# Patient Record
Sex: Male | Born: 1995 | Race: Black or African American | Hispanic: No | Marital: Single | Smoking: Never smoker
Health system: Southern US, Community
[De-identification: ages and names within clinical notes are randomized; demographics above are authoritative.]

## PROBLEM LIST (undated history)

## (undated) DIAGNOSIS — J45909 Unspecified asthma, uncomplicated: Secondary | ICD-10-CM

## (undated) HISTORY — PX: WISDOM TOOTH EXTRACTION: SHX21

---
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
## 2016-05-28 ENCOUNTER — Encounter (HOSPITAL_COMMUNITY): Payer: Self-pay | Admitting: Emergency Medicine

## 2016-05-28 ENCOUNTER — Emergency Department (HOSPITAL_COMMUNITY)
Admission: EM | Admit: 2016-05-28 | Discharge: 2016-05-28 | Disposition: A | Discharge status: Home / Self Care | Payer: Medicaid Other | Attending: Emergency Medicine | Admitting: Emergency Medicine

## 2016-05-28 DIAGNOSIS — Y999 Unspecified external cause status: Secondary | ICD-10-CM | POA: Diagnosis not present

## 2016-05-28 DIAGNOSIS — T2112XA Burn of first degree of abdominal wall, initial encounter: Secondary | ICD-10-CM | POA: Diagnosis not present

## 2016-05-28 DIAGNOSIS — T2102XA Burn of unspecified degree of abdominal wall, initial encounter: Secondary | ICD-10-CM | POA: Diagnosis present

## 2016-05-28 DIAGNOSIS — Y929 Unspecified place or not applicable: Secondary | ICD-10-CM | POA: Diagnosis not present

## 2016-05-28 DIAGNOSIS — T300 Burn of unspecified body region, unspecified degree: Secondary | ICD-10-CM

## 2016-05-28 DIAGNOSIS — X17XXXA Contact with hot engines, machinery and tools, initial encounter: Secondary | ICD-10-CM | POA: Insufficient documentation

## 2016-05-28 DIAGNOSIS — J45909 Unspecified asthma, uncomplicated: Secondary | ICD-10-CM | POA: Diagnosis not present

## 2016-05-28 DIAGNOSIS — Y9389 Activity, other specified: Secondary | ICD-10-CM | POA: Diagnosis not present

## 2016-05-28 HISTORY — DX: Unspecified asthma, uncomplicated: J45.909

## 2016-05-28 MED ORDER — SILVER SULFADIAZINE 1 % EX CREA: 1.0000 "application " | TOPICAL_CREAM | Freq: Every day | CUTANEOUS | 0 refills | Status: DC

## 2016-05-28 NOTE — ED Notes (Signed)
Pt stable, ambulatory, states understanding of discharge instructions 
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
## 2016-07-08 ENCOUNTER — Emergency Department (HOSPITAL_COMMUNITY)
Admission: EM | Admit: 2016-07-08 | Discharge: 2016-07-08 | Disposition: A | Discharge status: Home / Self Care | Payer: Medicaid Other | Attending: Emergency Medicine | Admitting: Emergency Medicine

## 2016-07-08 ENCOUNTER — Emergency Department (HOSPITAL_COMMUNITY): Payer: Medicaid Other

## 2016-07-08 ENCOUNTER — Encounter (HOSPITAL_COMMUNITY): Payer: Self-pay | Admitting: *Deleted

## 2016-07-08 DIAGNOSIS — R05 Cough: Secondary | ICD-10-CM

## 2016-07-08 DIAGNOSIS — J4520 Mild intermittent asthma, uncomplicated: Secondary | ICD-10-CM | POA: Insufficient documentation

## 2016-07-08 DIAGNOSIS — J069 Acute upper respiratory infection, unspecified: Secondary | ICD-10-CM | POA: Diagnosis not present

## 2016-07-08 DIAGNOSIS — R059 Cough, unspecified: Secondary | ICD-10-CM

## 2016-07-08 MED ORDER — ALBUTEROL SULFATE HFA 108 (90 BASE) MCG/ACT IN AERS
2.0000 | INHALATION_SPRAY | Freq: Once | RESPIRATORY_TRACT | Status: AC
  Administered 2016-07-08: 2 via RESPIRATORY_TRACT
  Filled 2016-07-08: qty 6.7

## 2016-07-08 NOTE — ED Triage Notes (Signed)
Pt reports onset last night of feeling sob and cough. Hx of asthma. spo2 96% at triage. No acute distress is noted at triage.

## 2016-07-08 NOTE — ED Provider Notes (Signed)
[facility] Provider Note   CSN: [id number] Arrival date & time: 07/08/16  1531  By signing my name below, I, Arianna Nassar, attest that this documentation has been prepared under the direction and in the presence of Nira ConnPedro Eduardo Shantae Vantol, MD.  Electronically Signed: Octavia HeirArianna Nassar, ED Scribe. 07/08/16. 4:56 PM.    History   Chief Complaint Chief Complaint  Patient presents with  . Cough    The history is provided by the patient. No language interpreter was used.   HPI Comments: David Dodson is a 20 y.o. male who has a Pmhx of asthma presents to the Emergency Department complaining of intermittent, productive cough that brings up yellow sputum that started recently. He reports rhinorrhea, congestion, and wheezing. Pt has been around his brothers at home who have been sick. He reports working in the cold at his job and works 7 days a week. He states he has not had any time to rest. Pt has a hx of asthma and reports not using his inhaler due to it being very low. He has not taken any medications to relieve his pain. He denies fever  Past Medical History:  Diagnosis Date  . Asthma     There are no active problems to display for this patient.   Past Surgical History:  Procedure Laterality Date  . WISDOM TOOTH EXTRACTION         Home Medications    Prior to Admission medications   Medication Sig Start Date End Date Taking? Authorizing Provider  silver sulfADIAZINE (SILVADENE) 1 % cream Apply 1 application topically daily. 05/28/16   Anselm PancoastShawn C Joy, PA-C    Family History History reviewed. No pertinent family history.  Social History Social History  Substance Use Topics  . Smoking status: Never Smoker  . Smokeless tobacco: Never Used  . Alcohol use No     Allergies   Patient has no known allergies.   Review of Systems Review of Systems  A complete 10 system review of systems was obtained and all systems are negative except as noted in the HPI and PMH.     Physical Exam Updated Vital Signs BP 119/68 (BP Location: Right Arm)   Pulse 97   Temp 99.1 F (37.3 C) (Oral)   Resp 18   Ht 5\' 5"  (1.651 m)   Wt 145 lb (65.8 kg)   SpO2 96%   BMI 24.13 kg/m   Physical Exam  Constitutional: He is oriented to person, place, and time. He appears well-developed and well-nourished. No distress.  HENT:  Head: Normocephalic and atraumatic.  Nose: Nose normal.  Swollen nasal mucosa   Eyes: Conjunctivae and EOM are normal. Pupils are equal, round, and reactive to light. Right eye exhibits no discharge. Left eye exhibits no discharge. No scleral icterus.  Neck: Normal range of motion. Neck supple.  Cardiovascular: Normal rate and regular rhythm.  Exam reveals no gallop and no friction rub.   No murmur heard. Pulmonary/Chest: Effort normal. No stridor. No respiratory distress. He has wheezes. He has no rales.  Abdominal: Soft. He exhibits no distension. There is no tenderness.  Musculoskeletal: He exhibits no edema or tenderness.  Neurological: He is alert and oriented to person, place, and time.  Skin: Skin is warm and dry. No rash noted. He is not diaphoretic. No erythema.  Psychiatric: He has a normal mood and affect.  Vitals reviewed.    ED Treatments / Results  DIAGNOSTIC STUDIES: Oxygen Saturation is 96% on RA, normal by  my interpretation.  COORDINATION OF CARE:  4:55 PM Discussed treatment plan with pt at bedside and pt agreed to plan.  Labs (all labs ordered are listed, but only abnormal results are displayed) Labs Reviewed - No data to display  EKG  EKG Interpretation None       Radiology Dg Chest 2 View  Result Date: 07/08/2016 CLINICAL DATA:  Chest pain and cough and shortness of breath for 2 days, initial encounter EXAM: CHEST  2 VIEW COMPARISON:  01/05/2009 FINDINGS: The heart size and mediastinal contours are within normal limits. Both lungs are clear. The visualized skeletal structures are unremarkable. IMPRESSION:  No active cardiopulmonary disease. Electronically Signed   By: Alcide CleverMark  Lukens M.D.   On: 07/08/2016 16:26    Procedures Procedures (including critical care time)  Medications Ordered in ED Medications  albuterol (PROVENTIL HFA;VENTOLIN HFA) 108 (90 Base) MCG/ACT inhaler 2 puff (2 puffs Inhalation Given 07/08/16 1707)     Initial Impression / Assessment and Plan / ED Course  I have reviewed the triage vital signs and the nursing notes.  Pertinent labs & imaging results that were available during my care of the patient were reviewed by me and considered in my medical decision making (see chart for details).  Clinical Course     20 y.o. male presents with cough, rhinorrhea, nasal congestion for several days. adequate oral hydration. Rest of history as above.  Patient appears well. No signs of toxicity, patient is interactive and playful. No hypoxia, tachypnea or other signs of respiratory distress. No sign of clinical dehydration. Lung exam wheezing. Rest of exam as above.  Chest x-ray w/o PNA. Wheezing improved following albuterol puffs.  Most consistent with viral upper respiratory infection.   No evidence suggestive of pharyngitis, AOM, PNA, or meningitis.    Discussed symptomatic treatment with the patient and they will follow closely with their PCP.    Final Clinical Impressions(s) / ED Diagnoses   Final diagnoses:  Upper respiratory tract infection, unspecified type  Cough  Mild intermittent asthma without complication   Disposition: Discharge  Condition: Good  I have discussed the results, Dx and Tx plan with the patient who expressed understanding and agree(s) with the plan. Discharge instructions discussed at great length. The patient was given strict return precautions who verbalized understanding of the instructions. No further questions at time of discharge.    Discharge Medication List as of 07/08/2016  5:00 PM      Follow Up: [facility] [address] [place] [id number] [phone] Call  For help establishing care with a care provider   I personally performed the services described in this documentation, which was scribed in my presence. The recorded information has been reviewed and is accurate.        Nira ConnPedro Eduardo Harriet Sutphen, MD 07/09/16 1240

## 2016-07-08 NOTE — ED Notes (Signed)
Pt transported to xray from lobby, will then be transported to [facility]

## 2016-10-15 ENCOUNTER — Observation Stay (HOSPITAL_COMMUNITY)
Admission: EM | Admit: 2016-10-15 | Discharge: 2016-10-16 | Disposition: A | Discharge status: Home / Self Care | Payer: Medicaid Other | Attending: Infectious Disease | Admitting: Infectious Disease

## 2016-10-15 ENCOUNTER — Emergency Department (HOSPITAL_COMMUNITY): Payer: Medicaid Other

## 2016-10-15 ENCOUNTER — Encounter (HOSPITAL_COMMUNITY): Payer: Self-pay

## 2016-10-15 DIAGNOSIS — J45901 Unspecified asthma with (acute) exacerbation: Secondary | ICD-10-CM | POA: Diagnosis not present

## 2016-10-15 DIAGNOSIS — J4541 Moderate persistent asthma with (acute) exacerbation: Principal | ICD-10-CM | POA: Insufficient documentation

## 2016-10-15 DIAGNOSIS — J9601 Acute respiratory failure with hypoxia: Secondary | ICD-10-CM | POA: Diagnosis not present

## 2016-10-15 DIAGNOSIS — R062 Wheezing: Secondary | ICD-10-CM | POA: Diagnosis present

## 2016-10-15 LAB — CBC WITH DIFFERENTIAL/PLATELET
BASOS ABS: 0 10*3/uL (ref 0.0–0.1)
BASOS PCT: 0 %
EOS ABS: 0.1 10*3/uL (ref 0.0–0.7)
EOS PCT: 1 %
HCT: 44.3 % (ref 39.0–52.0)
Hemoglobin: 15.8 g/dL (ref 13.0–17.0)
Lymphocytes Relative: 7 %
Lymphs Abs: 1 10*3/uL (ref 0.7–4.0)
MCH: 31.7 pg (ref 26.0–34.0)
MCHC: 35.7 g/dL (ref 30.0–36.0)
MCV: 88.8 fL (ref 78.0–100.0)
MONO ABS: 1.2 10*3/uL — AB (ref 0.1–1.0)
Monocytes Relative: 8 %
NEUTROS ABS: 12.4 10*3/uL — AB (ref 1.7–7.7)
Neutrophils Relative %: 84 %
PLATELETS: 221 10*3/uL (ref 150–400)
RBC: 4.99 MIL/uL (ref 4.22–5.81)
RDW: 12.7 % (ref 11.5–15.5)
WBC: 14.7 10*3/uL — ABNORMAL HIGH (ref 4.0–10.5)

## 2016-10-15 LAB — BASIC METABOLIC PANEL
ANION GAP: 11 (ref 5–15)
BUN: 9 mg/dL (ref 6–20)
CALCIUM: 9.5 mg/dL (ref 8.9–10.3)
CO2: 26 mmol/L (ref 22–32)
CREATININE: 1.04 mg/dL (ref 0.61–1.24)
Chloride: 103 mmol/L (ref 101–111)
GFR calc Af Amer: >60 mL/min (ref >60)
GLUCOSE: 129 mg/dL — AB (ref 65–99)
Potassium: 3.5 mmol/L (ref 3.5–5.1)
Sodium: 140 mmol/L (ref 135–145)

## 2016-10-15 LAB — INFLUENZA PANEL BY PCR (TYPE A & B)
INFLAPCR: NEGATIVE
Influenza B By PCR: NEGATIVE

## 2016-10-15 MED ORDER — METHYLPREDNISOLONE SODIUM SUCC 125 MG IJ SOLR
125.0000 mg | Freq: Once | INTRAMUSCULAR | Status: AC
  Administered 2016-10-15: 125 mg via INTRAVENOUS
  Filled 2016-10-15: qty 2

## 2016-10-15 MED ORDER — ALBUTEROL (5 MG/ML) CONTINUOUS INHALATION SOLN
10.0000 mg/h | INHALATION_SOLUTION | Freq: Once | RESPIRATORY_TRACT | Status: AC
  Administered 2016-10-15: 10 mg/h via RESPIRATORY_TRACT
  Filled 2016-10-15: qty 20

## 2016-10-15 MED ORDER — IPRATROPIUM-ALBUTEROL 0.5-2.5 (3) MG/3ML IN SOLN
RESPIRATORY_TRACT | Status: AC
Start: 2016-10-15 — End: 2016-10-15
  Filled 2016-10-15: qty 3

## 2016-10-15 MED ORDER — ALBUTEROL SULFATE (2.5 MG/3ML) 0.083% IN NEBU
5.0000 mg | INHALATION_SOLUTION | Freq: Once | RESPIRATORY_TRACT | Status: AC
  Administered 2016-10-15: 5 mg via RESPIRATORY_TRACT

## 2016-10-15 MED ORDER — IPRATROPIUM-ALBUTEROL 0.5-2.5 (3) MG/3ML IN SOLN
3.0000 mL | Freq: Four times a day (QID) | RESPIRATORY_TRACT | Status: DC
  Administered 2016-10-15 – 2016-10-16 (×4): 3 mL via RESPIRATORY_TRACT
  Filled 2016-10-15 (×5): qty 3

## 2016-10-15 MED ORDER — POTASSIUM CHLORIDE CRYS ER 20 MEQ PO TBCR: 40.0000 meq | EXTENDED_RELEASE_TABLET | Freq: Once | ORAL | Status: DC

## 2016-10-15 MED ORDER — ENOXAPARIN SODIUM 40 MG/0.4ML ~~LOC~~ SOLN
40.0000 mg | SUBCUTANEOUS | Status: DC
  Filled 2016-10-15: qty 0.4

## 2016-10-15 MED ORDER — MAGNESIUM SULFATE 2 GM/50ML IV SOLN
2.0000 g | Freq: Once | INTRAVENOUS | Status: AC
  Administered 2016-10-15: 2 g via INTRAVENOUS
  Filled 2016-10-15: qty 50

## 2016-10-15 MED ORDER — ALBUTEROL SULFATE (2.5 MG/3ML) 0.083% IN NEBU
INHALATION_SOLUTION | RESPIRATORY_TRACT | Status: AC
  Filled 2016-10-15: qty 6

## 2016-10-15 MED ORDER — IPRATROPIUM-ALBUTEROL 0.5-2.5 (3) MG/3ML IN SOLN
3.0000 mL | Freq: Once | RESPIRATORY_TRACT | Status: AC
Start: 2016-10-15 — End: 2016-10-15
  Administered 2016-10-15: 3 mL via RESPIRATORY_TRACT

## 2016-10-15 MED ORDER — PREDNISONE 20 MG PO TABS
40.0000 mg | ORAL_TABLET | Freq: Every day | ORAL | Status: DC
  Administered 2016-10-16: 40 mg via ORAL
  Filled 2016-10-15: qty 2

## 2016-10-15 NOTE — H&P (Signed)
Date: 10/15/2016               Patient Name:  David Dodson MRN: [id number]  DOB: March 17, 1996 Age / Sex: 21 y.o., male   PCP: No Pcp Per Patient         Medical Service: Internal Medicine Teaching Service         Attending Physician: Dr. Randall Hissornelius N Van Dam, MD    First Contact: Dr. Carolynn CommentBryan Annalee Meyerhoff Pager: [phone]  Second Contact: Dr. Orest Dikeshris Rice Pager: [phone]       After Hours (After 5p /  First Contact Pager: [phone]  Weekends / Holidays): Second Contact Pager: [phone]   Chief Complaint: SOB  History of Present Illness: David Dodson is a 21 y.o. male with a h/o of asthma who presents with wheezing and SOB found to be hypoxic in the ED.  Pt reports acute onset of wheezing and SOB yesterday while playing video games. He reports that he experienced SOB and chest tightness. He had a cough which was non-productive. He describes that his chest began to hurt after coughing, but denies any sharp, aching, or burning sensation and reports it felt "tight" and "like I couldn't breathe." He went to take a hot shower and vomited in the shower, then twice more in rapid succession. He denies any feeling of nausea prior to this emesis or following. He had not issues during the night, but awoke with significant SOB and knew that he needed to go to the hospital. He does report that he tried to use his Flovent at home, but had only mild relief from this. He reportedly does not take this regularly.  He has a h/o asthma since early childhood, but has not had significant exacerbation in several years. His mother reports that prior exacerbations were triggered by changes to the weather and respiratory infections. He denies any recent sick contacts, fever/chills, sore throat, rhinorrhea, nasal/sinus congestion, HA, myalgias. Denies abd pain, diarrhea, urinary symptoms. No LE swelling, no pleuritic pain. He does not have a regular doctor in town since [place]ocating to [place] 1 [place] ago, but reports that he  had followed with a pediatric specialist in the past.  His O2 sats were down to 87% on RA w/ good response to supplemental O2 via [place]. He was given methylprednisolone and nebulizer treatments and admitted to IMTS. His chest pain was resolved upon our interview and breathing was comfortable on [place]. No further coughing since arrival.  Meds: Current Outpatient Prescriptions  Medication Sig Dispense Refill  . albuterol (PROVENTIL HFA;VENTOLIN HFA) 108 (90 Base) MCG/ACT inhaler Inhale 1 puff into the lungs every 6 (six) hours as needed for wheezing or shortness of breath.    . fluticasone (FLOVENT HFA) 110 MCG/ACT inhaler Inhale 1 puff into the lungs 2 (two) times daily.     Allergies: Allergies as of 10/15/2016  . (No Known Allergies)   Past Medical History:  Diagnosis Date  . Asthma    Family History: Pt has no family h/o lung disease.  Social History: Pt  reports that he has never smoked. He has never used smokeless tobacco. He reports that he uses drugs, including Marijuana. He reports that he does not drink alcohol.  Review of Systems: A complete ROS was negative except as per HPI. Review of Systems  Constitutional: Negative for chills, fever and malaise/fatigue.  HENT: Negative for congestion, sinus pain and sore throat.   Respiratory: Positive for cough, shortness of breath and wheezing. Negative for  hemoptysis and sputum production.   Cardiovascular: Positive for chest pain (tightness). Negative for leg swelling.  Gastrointestinal: Positive for vomiting. Negative for abdominal pain, constipation, diarrhea and nausea.  Neurological: Negative for dizziness, loss of consciousness, weakness and headaches.   Physical Exam: Vitals:   10/15/16 1121 10/15/16 1124 10/15/16 1126 10/15/16 1130  BP:    119/75  Pulse: (!) 120 (!) 117 (!) 128 (!) 119  Resp: (!) 29 (!) 27 (!) 26 (!) 21  Temp:      TempSrc:      SpO2: (!) 87% (!) 87% (!) 88% 92%  Weight:      Height:       Physical  Exam  Constitutional: He is oriented to person, place, and time. He appears well-developed and well-nourished. He is cooperative. No distress.  HENT:  Head: Normocephalic and atraumatic.  Right Ear: Hearing normal.  Left Ear: Hearing normal.  Nose: Nose normal. Right sinus exhibits no maxillary sinus tenderness and no frontal sinus tenderness. Left sinus exhibits no maxillary sinus tenderness and no frontal sinus tenderness.  Mouth/Throat: Mucous membranes are normal. Posterior oropharyngeal erythema present.  Cardiovascular: Regular rhythm, S1 normal, S2 normal and intact distal pulses.  Tachycardia present.  Exam reveals no gallop.   No murmur heard. Pulmonary/Chest: Effort normal. No respiratory distress. He has wheezes. He has no rhonchi. He has no rales. He exhibits no tenderness.  Good air movement to bases. Comfortable on 2L [place]  Abdominal: Soft. Normal appearance and bowel sounds are normal. He exhibits no ascites. There is no hepatosplenomegaly. There is no tenderness.  Musculoskeletal: He exhibits no edema.  Lymphadenopathy:       Head (right side): Submandibular adenopathy present.       Head (left side): No submandibular adenopathy present.    He has no cervical adenopathy.  Neurological: He is alert and oriented to person, place, and time. He has normal strength.  Skin: Skin is warm, dry and intact. He is not diaphoretic.  Psychiatric: He has a normal mood and affect. His speech is normal and behavior is normal.   Labs: CBC:  Recent Labs Lab 10/15/16 0943  WBC 14.7*  NEUTROABS 12.4*  HGB 15.8  HCT 44.3  MCV 88.8  PLT 221   Basic Metabolic Panel:  Recent Labs Lab 10/15/16 0943  NA 140  K 3.5  CL 103  CO2 26  GLUCOSE 129*  BUN 9  CREATININE 1.04  CALCIUM 9.5   Imaging: Dg Chest 2 View  Result Date: 10/15/2016 CLINICAL DATA:  Dyspnea EXAM: CHEST  2 VIEW COMPARISON:  07/08/2016 chest radiograph. FINDINGS: Stable cardiomediastinal silhouette with normal  heart size. No pneumothorax. No pleural effusion. Lungs appear clear, with no acute consolidative airspace disease and no pulmonary edema. Visualized osseous structures appear intact. IMPRESSION: No active cardiopulmonary disease. Electronically Signed   By: Delbert Phenix M.D.   On: 10/15/2016 09:24   Assessment & Plan by Problem: Active Problems:   Acute asthma exacerbation  David Dodson is a 21 y.o. male with h/o asthma who presents with SOB, wheezing, and non-productive cough thought to be 2/2 acute asthma exacerbation.  1) Acute Hypoxic Resp Failure 2/2 Asthma Exacerbation: h/o asthma, now with acute worsening of wheeze, SOB and chest tightness. Resolved with ED nebs and methylpred. No h/o tobacco abuse, reports some second hand exposure. H/o exacerbation with cold weather and past viral illness. No fevers/chills, but leukocytosis to 14. CXR clear. Home meds: albuterol (not used recently), fluticasone (not  used daily). - admit to tele - Influenza PCR - s/p IV methylpred, continue PO pred 40mg  x5 days - Duonebs q6h - peak flow  DVT PPx - low molecular weight heparin  Code Status - Full  Dispo: Admit patient to Observation with expected length of stay less than 2 midnights.  Signed: Carolynn Comment, MD 10/15/2016, 11:44 AM  Pager: [phone]

## 2016-10-15 NOTE — ED Triage Notes (Signed)
Per Pt, Pt is coming from home with inspiratory and expiratory wheezing that started yesterday. Hx of Asthma. Denies no relief with inhaler.

## 2016-10-15 NOTE — ED Notes (Signed)
sats 87% on room air; now 95% on 2 L [place].

## 2016-10-15 NOTE — ED Notes (Addendum)
Pt returned from xray; lips look purple tinged and color in face looks dusky.

## 2016-10-15 NOTE — ED Notes (Signed)
Pt's room air sat is 89-91% with good waveform while lying in bed. RN informed MD and 2 L [place] placed. RN will not ambulate at this time

## 2016-10-15 NOTE — ED Notes (Signed)
Attempted report 

## 2016-10-15 NOTE — ED Notes (Signed)
Admitting at bedside 

## 2016-10-15 NOTE — ED Notes (Signed)
Resp at bedside to evaluate.

## 2016-10-15 NOTE — ED Notes (Signed)
Breathing tx continues; pt has no needs and no signs of distress.

## 2016-10-15 NOTE — ED Notes (Signed)
Pt's breathing tx finished. Airway is opened up more. RN to assess how patient maintains on room air. MD updated.

## 2016-10-15 NOTE — ED Provider Notes (Signed)
[facility]-EMER[facility]ENCY DEPT Provider Note   CSN: [id number] Arrival date & time: 10/15/16  [id number]     History   Chief Complaint Chief Complaint  Patient presents with  . Wheezing    HPI David Dodson is a 21 y.o. male.  Patient is a 21 year old male with past medical history of asthma. He presents for evaluation of wheezing and shortness of breath. This is worsened over the past several days. He denies any fevers, chills, but does report he has been coughing. His cough is been nonproductive. He is a prior smoker, however quit many weeks ago.   The history is provided by the patient.  Wheezing   This is a new problem. The current episode started 2 days ago. The problem occurs constantly. The problem has been gradually worsening. Pertinent negatives include no chest pain and no fever. There was no precipitant for this problem. He has tried beta-agonist inhalers for the symptoms. The treatment provided no relief. His past medical history is significant for asthma.    Past Medical History:  Diagnosis Date  . Asthma     There are no active problems to display for this patient.   Past Surgical History:  Procedure Laterality Date  . WISDOM TOOTH EXTRACTION         Home Medications    Prior to Admission medications   Medication Sig Start Date End Date Taking? Authorizing Provider  silver sulfADIAZINE (SILVADENE) 1 % cream Apply 1 application topically daily. 05/28/16   Anselm PancoastShawn C Joy, PA-C    Family History No family history on file.  Social History Social History  Substance Use Topics  . Smoking status: Never Smoker  . Smokeless tobacco: Never Used  . Alcohol use No     Allergies   Patient has no known allergies.   Review of Systems Review of Systems  Constitutional: Negative for fever.  Respiratory: Positive for wheezing.   Cardiovascular: Negative for chest pain.  All other systems reviewed and are negative.    Physical Exam Updated Vital Signs BP 136/85    Pulse (!) 120   Temp 97.8 F (36.6 C) (Oral)   Resp (!) 24   Ht 5\' 4"  (1.626 m)   Wt 140 lb (63.5 kg)   SpO2 98%   BMI 24.03 kg/m   Physical Exam  Constitutional: He is oriented to person, place, and time. He appears well-developed and well-nourished. No distress.  HENT:  Head: Normocephalic and atraumatic.  Mouth/Throat: Oropharynx is clear and moist.  Neck: Normal range of motion. Neck supple.  Cardiovascular: Normal rate and regular rhythm.  Exam reveals no friction rub.   No murmur heard. Pulmonary/Chest: He is in respiratory distress. He has wheezes. He has no rales.  There are bilateral expiratory wheezes present. He is in mild respiratory distress.  Abdominal: Soft. Bowel sounds are normal. He exhibits no distension. There is no tenderness.  Musculoskeletal: Normal range of motion. He exhibits no edema.  Neurological: He is alert and oriented to person, place, and time. Coordination normal.  Skin: Skin is warm and dry. He is not diaphoretic.  Nursing note and vitals reviewed.    ED Treatments / Results  Labs (all labs ordered are listed, but only abnormal results are displayed) Labs Reviewed  BASIC METABOLIC PANEL  CBC WITH DIFFERENTIAL/PLATELET    EKG  EKG Interpretation None       Radiology Dg Chest 2 View  Result Date: 10/15/2016 CLINICAL DATA:  Dyspnea EXAM: CHEST  2 VIEW COMPARISON:  07/08/2016 chest radiograph. FINDINGS: Stable cardiomediastinal silhouette with normal heart size. No pneumothorax. No pleural effusion. Lungs appear clear, with no acute consolidative airspace disease and no pulmonary edema. Visualized osseous structures appear intact. IMPRESSION: No active cardiopulmonary disease. Electronically Signed   By: Delbert Phenix M.D.   On: 10/15/2016 09:24    Procedures Procedures (including critical care time)  Medications Ordered in ED Medications  albuterol (PROVENTIL) (2.5 MG/3ML) 0.083% nebulizer solution (not administered)    ipratropium-albuterol (DUONEB) 0.5-2.5 (3) MG/3ML nebulizer solution 3 mL (not administered)  methylPREDNISolone sodium succinate (SOLU-MEDROL) 125 mg/2 mL injection 125 mg (not administered)  magnesium sulfate IVPB 2 g 50 mL (not administered)  albuterol (PROVENTIL,VENTOLIN) solution continuous neb (not administered)  albuterol (PROVENTIL) (2.5 MG/3ML) 0.083% nebulizer solution 5 mg (5 mg Nebulization Given 10/15/16 0855)     Initial Impression / Assessment and Plan / ED Course  I have reviewed the triage vital signs and the nursing notes.  Pertinent labs & imaging results that were available during my care of the patient were reviewed by me and considered in my medical decision making (see chart for details).  Patient with severe asthma exacerbation. Remains hypoxic after continuous albuterol, duonebs, steroids, and magnesium. He will be admitted to the teaching service for further treatment.  CRITICAL CARE Performed by: Geoffery Lyons Total critical care time: 30 minutes Critical care time was exclusive of separately billable procedures and treating other patients. Critical care was necessary to treat or prevent imminent or life-threatening deterioration. Critical care was time spent personally by me on the following activities: development of treatment plan with patient and/or surrogate as well as nursing, discussions with consultants, evaluation of patient's response to treatment, examination of patient, obtaining history from patient or surrogate, ordering and performing treatments and interventions, ordering and review of laboratory studies, ordering and review of radiographic studies, pulse oximetry and re-evaluation of patient's condition.   Final Clinical Impressions(s) / ED Diagnoses   Final diagnoses:  None    New Prescriptions New Prescriptions   No medications on file     Geoffery Lyons, MD 10/15/16 1345

## 2016-10-16 DIAGNOSIS — J45901 Unspecified asthma with (acute) exacerbation: Secondary | ICD-10-CM | POA: Diagnosis not present

## 2016-10-16 DIAGNOSIS — J9601 Acute respiratory failure with hypoxia: Secondary | ICD-10-CM

## 2016-10-16 LAB — CBC
HEMATOCRIT: 41.2 % (ref 39.0–52.0)
Hemoglobin: 14.4 g/dL (ref 13.0–17.0)
MCH: 31 pg (ref 26.0–34.0)
MCHC: 35 g/dL (ref 30.0–36.0)
MCV: 88.6 fL (ref 78.0–100.0)
PLATELETS: 248 10*3/uL (ref 150–400)
RBC: 4.65 MIL/uL (ref 4.22–5.81)
RDW: 12.8 % (ref 11.5–15.5)
WBC: 12.9 10*3/uL — ABNORMAL HIGH (ref 4.0–10.5)

## 2016-10-16 LAB — HIV ANTIBODY (ROUTINE TESTING W REFLEX): HIV SCREEN 4TH GENERATION: NONREACTIVE

## 2016-10-16 MED ORDER — ALBUTEROL SULFATE HFA 108 (90 BASE) MCG/ACT IN AERS: 1.0000 | INHALATION_SPRAY | Freq: Four times a day (QID) | RESPIRATORY_TRACT | 0 refills | Status: DC | PRN

## 2016-10-16 MED ORDER — PREDNISONE 20 MG PO TABS: 40.0000 mg | ORAL_TABLET | Freq: Every day | ORAL | 0 refills | Status: AC

## 2016-10-16 MED ORDER — ORAL CARE MOUTH RINSE
15.0000 mL | Freq: Two times a day (BID) | OROMUCOSAL | Status: DC
  Administered 2016-10-16: 15 mL via OROMUCOSAL

## 2016-10-16 NOTE — Progress Notes (Signed)
Discharge instructions (including medications) discussed with and copy provided to patient/caregiver 

## 2016-10-16 NOTE — Progress Notes (Signed)
   Subjective: Currently, the patient feels much improved. Breathing stable ON. Wheeze better, w/o cough. Ready for DC home. Understands peak flow and how to use. Will refer to PCP for continued eval and management of asthma.  Objective: Vital signs in last 24 hours: Vitals:   10/15/16 2210 10/16/16 0224 10/16/16 0510 10/16/16 0819  BP: (!) 123/53  122/66 127/72  Pulse: (!) 107 (!) 106 94 94  Resp: 18 18 18 18   Temp: 99.1 F (37.3 C)  98.6 F (37 C) 98 F (36.7 C)  TempSrc: Oral  Oral Oral  SpO2: 98% 98% 97% 94%  Weight: 144 lb 10 oz (65.6 kg)     Height:       Physical Exam: Physical Exam  Constitutional: No distress.  Cardiovascular: Normal rate, regular rhythm and normal heart sounds.   Pulmonary/Chest: Effort normal. No respiratory distress. He has wheezes (scattered, lungs mildly tight). He has no rales.  Abdominal: Soft. Bowel sounds are normal. There is no tenderness.  Musculoskeletal: He exhibits no edema.   Labs: CBC:  Recent Labs Lab 10/15/16 0943 10/16/16 0315  WBC 14.7* 12.9*  NEUTROABS 12.4*  --   HGB 15.8 14.4  HCT 44.3 41.2  MCV 88.8 88.6  PLT 221 248   Metabolic Panel:  Recent Labs Lab 10/15/16 0943  NA 140  K 3.5  CL 103  CO2 26  GLUCOSE 129*  BUN 9  CREATININE 1.04  CALCIUM 9.5    Medications: Scheduled Medications: . enoxaparin (LOVENOX) injection  40 mg Subcutaneous Q24H  . ipratropium-albuterol  3 mL Nebulization Q6H  . mouth rinse  15 mL Mouth Rinse BID  . predniSONE  40 mg Oral Q breakfast   Assessment/Plan: Pt is a 21 y.o. yo male with a PMHx of asthma who was admitted on 10/15/2016 with symptoms of wheezing and SOB, which was determined to be secondary to asthma exacerbation.  1) Acute Hypoxic Resp Failure 2/2 Asthma Exacerbation: Resolving w/o O2 requirement today. Still mild wheeze and slightly tight lungs. Influenza negative, WBC trending down. Will DC w/ completion of 5 day pred and outpt f/u. - PO pred 40mg  x5 days -  albuterol HFA PRN - peak flow  Length of Stay: 0 day(s) Dispo: Anticipated discharge today.  Carolynn CommentBryan Trevyon Swor, MD Pager: [phone] (7AM-5PM) 10/16/2016, 11:28 AM

## 2016-10-16 NOTE — Discharge Summary (Signed)
Name: David Dodson MRN: [id number] DOB: [birth year] 21 y.o. PCP: No Pcp Per Patient  Date of Admission: 10/15/2016  9:08 AM Date of Discharge: 10/16/2016 Attending Physician: Randall Hiss, MD  Discharge Diagnosis: Active Problems:   Acute asthma exacerbation   Acute respiratory failure with hypoxia (HCC) secondary to asthma exacerbation  Discharge Medications: Allergies as of 10/16/2016   No Known Allergies     Medication List    STOP taking these medications   fluticasone 110 MCG/ACT inhaler Commonly known as:  FLOVENT HFA   silver sulfADIAZINE 1 % cream Commonly known as:  SILVADENE     TAKE these medications   albuterol 108 (90 Base) MCG/ACT inhaler Commonly known as:  PROVENTIL HFA;VENTOLIN HFA Inhale 1 puff into the lungs every 6 (six) hours as needed for wheezing or shortness of breath.   predniSONE 20 MG tablet Commonly known as:  DELTASONE Take 2 tablets (40 mg total) by mouth daily with breakfast. Start taking on:  10/17/2016      Disposition and follow-up:   Mr.David Dodson was discharged from [facility] in Good condition.  At the hospital follow up visit please address:  1.  Asthma: Appears to have been previously well controlled w/o medications. Assess for resolution of correct exacerbation. Consider formal peak flow testing and follow for need of chronic ICS therapy.  2.  Labs / imaging needed at time of follow-up: consider Peak Flow  Follow-up Appointments: Referral for PCP have been offered.  Hospital Course by problem list: Active Problems:   Acute asthma exacerbation   Acute respiratory failure with hypoxia (HCC) secondary to asthma exacerbation   1. Hypoxic respiratory failure secondary to asthma exacerbation: Patient presented with acute shortness of breath 1 day prior to his admission. He reported that he felt very wheezy and had difficulty breathing. His symptoms lasted for several hours and were not well relieved  with his home Flovent medication. Patient has a history of asthma and has not had recent exacerbations several years. Patient went to bed and woke up the next morning feeling significantly worse in terms of his breathing. At that time he presented to the ED and was found to be hypoxic on room air to 87% which responded well to supplemental oxygen. He was given IV methylprednisone and continuous albuterol nebulizer treatment for one hour and had significant resolution of his symptoms. Patient however was unable to maintain his oxygen saturation without supplemental support and was admitted for overnight observation. By the next morning patient is feeling significantly better and was able to maintain his oxygen saturations on room air. He tolerated ambulation well and was discharged home with a continuation of prednisone for 4 more days. He was also discharged with a refill of his albuterol inhaler and instructions to discontinue Flovent until he has follow-up with his PCP. Patient should have more formal peak flow testing in order to assess the severity of his asthma and a regular follow-up with her PCP in order to assess whether he needs controller medications. Referral for her PCP has been made.  Discharge Vitals:   BP 127/72 (BP Location: Right Arm)   Pulse 94   Temp 98 F (36.7 C) (Oral)   Resp 18   Ht 5\' 4"  (1.626 m)   Wt 144 lb 10 oz (65.6 kg)   SpO2 94%   BMI 24.82 kg/m   Pertinent Labs, Studies, and Procedures: As below.  Procedures Performed:  Dg Chest 2 View  Result Date: 10/15/2016 CLINICAL DATA:  Dyspnea EXAM: CHEST  2 VIEW COMPARISON:  07/08/2016 chest radiograph. FINDINGS: Stable cardiomediastinal silhouette with normal heart size. No pneumothorax. No pleural effusion. Lungs appear clear, with no acute consolidative airspace disease and no pulmonary edema. Visualized osseous structures appear intact. IMPRESSION: No active cardiopulmonary disease. Electronically Signed   By: Delbert PhenixJason A  Poff M.D.   On: 10/15/2016 09:24   Discharge Instructions: Discharge Instructions    Call MD for:  difficulty breathing, headache or visual disturbances    Complete by:  As directed    Call MD for:  extreme fatigue    Complete by:  As directed    Call MD for:  persistant dizziness or light-headedness    Complete by:  As directed    Diet - low sodium heart healthy    Complete by:  As directed    Discharge instructions    Complete by:  As directed    You had an exacerbation of your asthma. Please take the the Prednisone for 4 more days to help you recover. Keep your albuterol inhaler handy in case you have other exacerbations and use this as needed. We will provide options for you to establish care with a Primary Care Doctor. You should let your doctor know if you have problems with wheezing in the future.   Increase activity slowly    Complete by:  As directed       Signed: Carolynn CommentBryan Nashae Maudlin, MD 10/16/2016, 1:59 PM   Pager: [phone]

## 2016-10-16 NOTE — Discharge Instructions (Signed)
Asthma, Adult Asthma is a condition of the lungs in which the airways tighten and narrow. Asthma can make it hard to breathe. Asthma cannot be cured, but medicine and lifestyle changes can help control it. Asthma may be started (triggered) by:  Animal skin flakes (dander).  Dust.  Cockroaches.  Pollen.  Mold.  Smoke.  Cleaning products.  Hair sprays or aerosol sprays.  Paint fumes or strong smells.  Cold air, weather changes, and winds.  Crying or laughing hard.  Stress.  Certain medicines or drugs.  Foods, such as dried fruit, potato chips, and sparkling grape juice.  Infections or conditions (colds, flu).  Exercise.  Certain medical conditions or diseases.  Exercise or tiring activities. Follow these instructions at home:  Take medicine as told by your doctor.  Use a peak flow meter as told by your doctor. A peak flow meter is a tool that measures how well the lungs are working.  Record and keep track of the peak flow meter's readings.  Understand and use the asthma action plan. An asthma action plan is a written plan for taking care of your asthma and treating your attacks.  To help prevent asthma attacks:  Do not smoke. Stay away from secondhand smoke.  Change your heating and air conditioning filter often.  Limit your use of fireplaces and wood stoves.  Get rid of pests (such as roaches and mice) and their droppings.  Throw away plants if you see mold on them.  Clean your floors. Dust regularly. Use cleaning products that do not smell.  Have someone vacuum when you are not home. Use a vacuum cleaner with a HEPA filter if possible.  Replace carpet with wood, tile, or vinyl flooring. Carpet can trap animal skin flakes and dust.  Use allergy-proof pillows, mattress covers, and box spring covers.  Wash bed sheets and blankets every week in hot water and dry them in a dryer.  Use blankets that are made of polyester or cotton.  Clean bathrooms  and kitchens with bleach. If possible, have someone repaint the walls in these rooms with mold-resistant paint. Keep out of the rooms that are being cleaned and painted.  Wash hands often. Contact a doctor if:  You have make a whistling sound when breaking (wheeze), have shortness of breath, or have a cough even if taking medicine to prevent attacks.  The colored mucus you cough up (sputum) is thicker than usual.  The colored mucus you cough up changes from clear or white to yellow, green, gray, or bloody.  You have problems from the medicine you are taking such as:  A rash.  Itching.  Swelling.  Trouble breathing.  You need reliever medicines more than 2-3 times a week.  Your peak flow measurement is still at 50-79% of your personal best after following the action plan for 1 hour.  You have a fever. Get help right away if:  You seem to be worse and are not responding to medicine during an asthma attack.  You are short of breath even at rest.  You get short of breath when doing very little activity.  You have trouble eating, drinking, or talking.  You have chest pain.  You have a fast heartbeat.  Your lips or finge  Asthma Attack Prevention, Adult Although you may not be able to control the fact that you have asthma, you can take actions to prevent episodes of asthma (asthma attacks). These actions include: Creating a written plan for managing and treating  your asthma attacks (asthma action plan). Monitoring your asthma. Avoiding things that can irritate your airways or make your asthma symptoms worse (asthma triggers). Taking your medicines as directed. Acting quickly if you have signs or symptoms of an asthma attack. What are some ways to prevent an asthma attack? Create a plan  Work with your health care provider to create an asthma action plan. This plan should include: A list of your asthma triggers and how to avoid them. A list of symptoms that you  experience during an asthma attack. Information about when to take medicine and how much medicine to take. Information to help you understand your peak flow measurements. Contact information for your health care providers. Daily actions that you can take to control asthma. Monitor your asthma   To monitor your asthma: Use your peak flow meter every morning and every evening for 2-3 weeks. Record the results in a journal. A drop in your peak flow numbers on one or more days may mean that you are starting to have an asthma attack, even if you are not having symptoms. When you have asthma symptoms, write them down in a journal. Avoid asthma triggers   Work with your health care provider to find out what your asthma triggers are. This can be done by: Being tested for allergies. Keeping a journal that notes when asthma attacks occur and what may have contributed to them. Asking your health care provider whether other medical conditions make your asthma worse. Common asthma triggers include: Dust. Smoke. This includes campfire smoke and secondhand smoke from tobacco products. Pet dander. Trees, grasses or pollens. Very cold, dry, or humid air. Mold. Foods that contain high amounts of sulfites. Strong smells. Engine exhaust and air pollution. Aerosol sprays and fumes from household cleaners. Household pests and their droppings, including dust mites and cockroaches. Certain medicines, including NSAIDs. Once you have determined your asthma triggers, take steps to avoid them. Depending on your triggers, you may be able to reduce the chance of an asthma attack by: Keeping your home clean. Have someone dust and vacuum your home for you 1 or 2 times a week. If possible, have them use a high-efficiency particulate arrestance (HEPA) vacuum. Washing your sheets weekly in hot water. Using allergy-proof mattress covers and casings on your bed. Keeping pets out of your home. Taking care of mold and  water problems in your home. Avoiding areas where people smoke. Avoiding using strong perfumes or odor sprays. Avoid spending a lot of time outdoors when pollen counts are high and on very windy days. Talking with your health care provider before stopping or starting any new medicines. Medicines  Take over-the-counter and prescription medicines only as told by your health care provider. Many asthma attacks can be prevented by carefully following your medicine schedule. Taking your medicines correctly is especially important when you cannot avoid certain asthma triggers. Even if you are doing well, do not stop taking your medicine and do not take less medicine. Act quickly  If an asthma attack happens, acting quickly can decrease how severe it is and how long it lasts. Take these actions: Pay attention to your symptoms. If you are coughing, wheezing, or having difficulty breathing, do not wait to see if your symptoms go away on their own. Follow your asthma action plan. If you have followed your asthma action plan and your symptoms are not improving, call your health care provider or seek immediate medical care at the nearest hospital. It is important  to write down how often you need to use your fast-acting rescue inhaler. You can track how often you use an inhaler in your journal. If you are using your rescue inhaler more often, it may mean that your asthma is not under control. Adjusting your asthma treatment plan may help you to prevent future asthma attacks and help you to gain better control of your condition. How can I prevent an asthma attack when I exercise?   Exercise is a common asthma trigger. To prevent asthma attacks during exercise: Follow advice from your health care provider about whether you should use your fast-acting inhaler before exercising. Many people with asthma experience exercise-induced bronchoconstriction (EIB). This condition often worsens during vigorous exercise in cold,  humid, or dry environments. Usually, people with EIB can stay very active by using a fast-acting inhaler before exercising. Avoid exercising outdoors in very cold or humid weather. Avoid exercising outdoors when pollen counts are high. Warm up and cool down when exercising. Stop exercising right away if asthma symptoms start. Consider taking part in exercises that are less likely to cause asthma symptoms such as: Indoor swimming. Biking. Walking. Hiking. Playing football. This information is not intended to replace advice given to you by your health care provider. Make sure you discuss any questions you have with your health care provider. Document Released: 07/01/2009 Document Revised: 03/13/2016 Document Reviewed: 12/28/2015 Elsevier Interactive Patient Education  2017 Elsevier Inc.  rnails start to turn blue.  You are light-headed, dizzy, or faint.  Your peak flow is less than 50% of your personal best. This information is not intended to replace advice given to you by your health care provider. Make sure you discuss any questions you have with your health care provider. Document Released: 12/30/2007 Document Revised: 12/19/2015 Document Reviewed: 02/09/2013 Elsevier Interactive Patient Education  2017 [employer].

## 2016-10-16 NOTE — Care Management Note (Signed)
Case Management Note  Patient Details  Name: David Dodson MRN: [id number] Date of Birth: 05/24/1996  Subjective/Objective:                 Spoke with patient and mother at bedside. Informed them Medicaid MD listed below as PCP per [employer]. Provided with instructions to call Medicaid to verify, and to request PCP be changed to local Medicaid provider. Provided with pamphlet for [facility] as a backup and explained clinic. Instructed to call and schedule appointment if they experience problems with Medicaid provider. They verbalized understanding, appreciate of consult. Mother stated they use [facility] for medications.    [facility]     Primary Care Provider: [facility]  Address: [address] [place] [id number]  Contact: [facility]  Telephone: [phone]  Telephone: [phone]  Contact: [facility]  Work Phone: [phone]       Action/Plan:  DC to home self care.  Expected Discharge Date:  10/16/16               Expected Discharge Plan:  Home/Self Care  In-House Referral:     Discharge planning Services  CM Consult  Post Acute Care Choice:    Choice offered to:  Patient, Parent  DME Arranged:    DME Agency:     HH Arranged:    HH Agency:     Status of Service:  Completed, signed off  If discussed at Long Length of Stay Meetings, dates discussed:    Additional Comments:  Lawerance SabalDebbie Tlaloc Taddei, RN 10/16/2016, 12:08 PM

## 2018-01-07 IMAGING — DX DG CHEST 2V
2 series · 2 of 2 positions shown · non-contrast
Comparison: 07/08/2016 chest radiograph.

CLINICAL DATA: Dyspnea

EXAM:
CHEST  2 VIEW

[w chest pa]
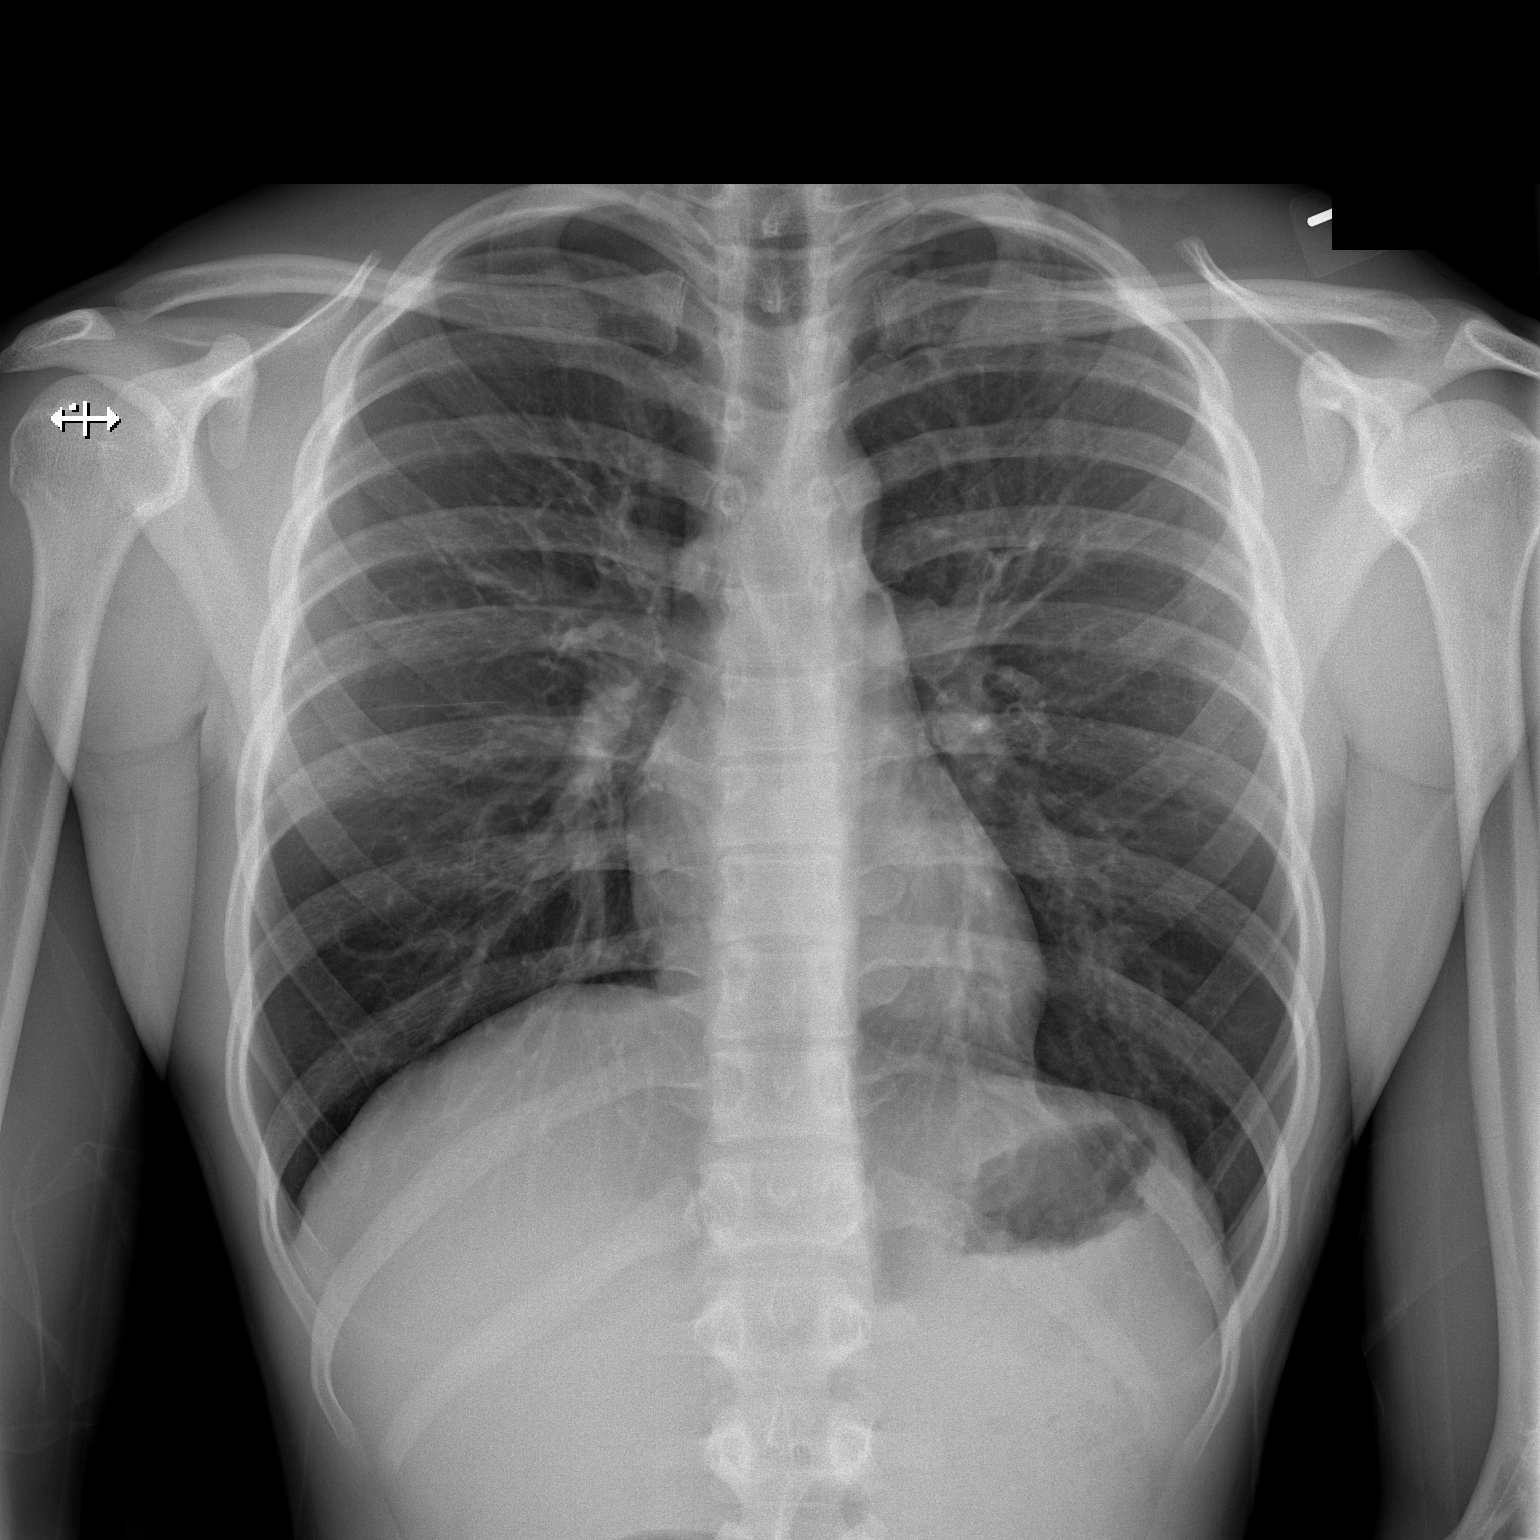

[w chest lat]
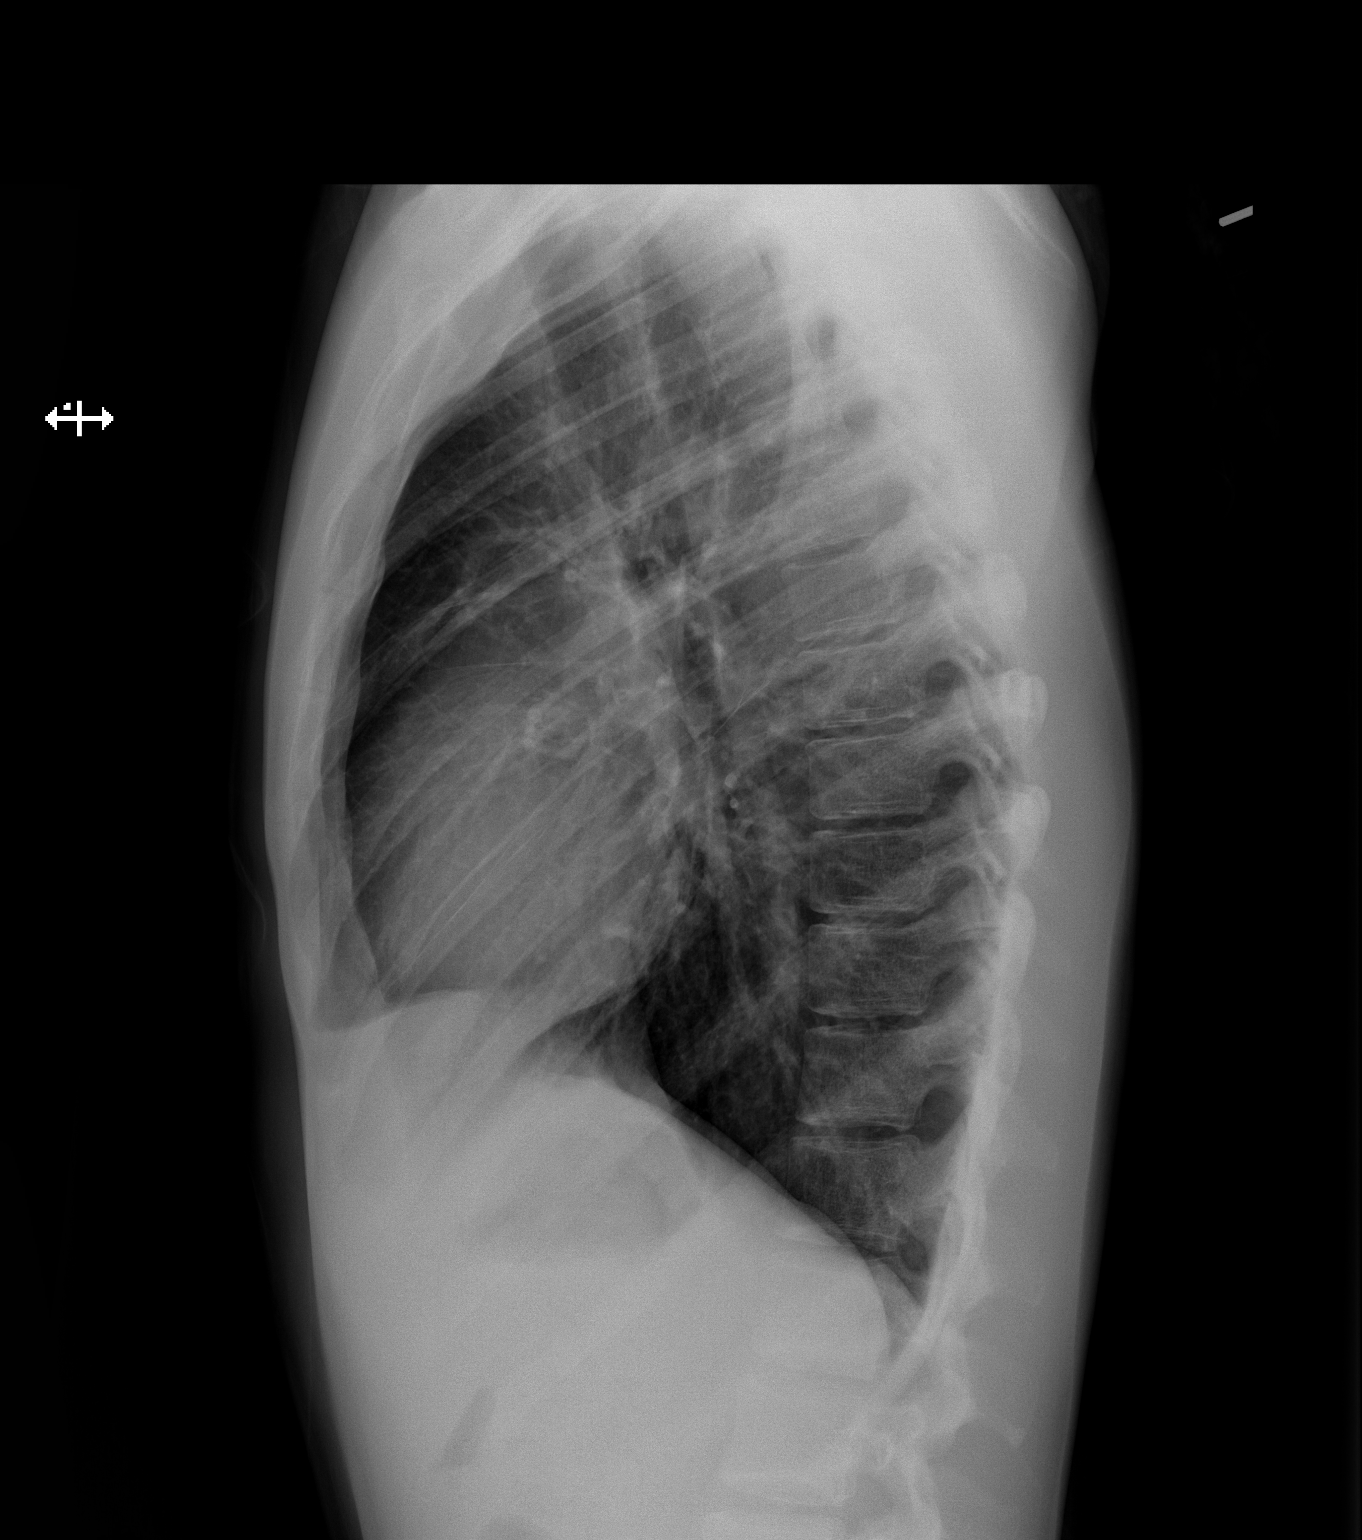

[2 of 2 positions shown; findings below may reference images not displayed]

FINDINGS: Stable cardiomediastinal silhouette with normal heart size. No
pneumothorax. No pleural effusion. Lungs appear clear, with no acute
consolidative airspace disease and no pulmonary edema. Visualized
osseous structures appear intact.
IMPRESSION: No active cardiopulmonary disease.

## 2020-01-10 ENCOUNTER — Emergency Department (HOSPITAL_COMMUNITY)
Admission: EM | Admit: 2020-01-10 | Discharge: 2020-01-10 | Disposition: A | Discharge status: Home / Self Care | Payer: Self-pay | Attending: Emergency Medicine | Admitting: Emergency Medicine

## 2020-01-10 ENCOUNTER — Other Ambulatory Visit: Payer: Self-pay

## 2020-01-10 ENCOUNTER — Encounter (HOSPITAL_COMMUNITY): Payer: Self-pay | Admitting: Emergency Medicine

## 2020-01-10 DIAGNOSIS — J4520 Mild intermittent asthma, uncomplicated: Secondary | ICD-10-CM | POA: Insufficient documentation

## 2020-01-10 DIAGNOSIS — Z760 Encounter for issue of repeat prescription: Secondary | ICD-10-CM | POA: Insufficient documentation

## 2020-01-10 MED ORDER — ALBUTEROL SULFATE HFA 108 (90 BASE) MCG/ACT IN AERS
2.0000 | INHALATION_SPRAY | Freq: Once | RESPIRATORY_TRACT | Status: AC
  Administered 2020-01-10: 2 via RESPIRATORY_TRACT
  Filled 2020-01-10: qty 6.7

## 2020-01-10 NOTE — ED Triage Notes (Signed)
Pt. Stated, I need my inhalers refilled , the weather is bothering me.

## 2020-01-10 NOTE — ED Provider Notes (Signed)
[facility] EMERGENCY DEPARTMENT Provider Note   CSN: [id number] Arrival date & time: 01/10/20  1017     History Chief Complaint  Patient presents with  . Asthma  . Medication Refill    David Dodson is a 24 y.o. male.  The history is provided by the patient and medical records. No language interpreter was used.  Asthma  Medication Refill    24 year old male with no history of asthma, presenting requesting for a refill of his inhaler.  Patient states he rarely has to use his inhaler but for the past 2 to 3 weeks he has been having to increase and use his rescue inhaler as needed.  He attributes to weather change and recently starting a new job working outside [employer].  For the past few times he is using inhaler to help with his breathing.  He endorsed some occasional nonproductive cough.  No report of any fever or chills hemoptysis, loss of taste or smell.  Endorse occasional wheezing.  He has not had his inhaler for approximately 1 week.  He is concerned of potential asthma exacerbation prompting this ER visit.  He denies any significant shortness of breath at this time.  He does not have a primary care provider.  He has not had his Covid vaccination.  Past Medical History:  Diagnosis Date  . Asthma     Patient Active Problem List   Diagnosis Date Noted  . Acute respiratory failure with hypoxia (HCC) secondary to asthma exacerbation 10/16/2016  . Acute asthma exacerbation 10/15/2016    Past Surgical History:  Procedure Laterality Date  . WISDOM TOOTH EXTRACTION         Family History  Problem Relation Age of Onset  . Asthma Neg Hx        Per mother  . Heart disease Neg Hx        Per mother    Social History   Tobacco Use  . Smoking status: Never Smoker  . Smokeless tobacco: Never Used  Substance Use Topics  . Alcohol use: No  . 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 Authorizing Provider  albuterol (PROVENTIL HFA;VENTOLIN HFA) 108 (90 Base) MCG/ACT inhaler Inhale 1 puff into the lungs every 6 (six) hours as needed for wheezing or shortness of breath. 10/16/16   Holley Raring, MD    Allergies    Patient has no known allergies.  Review of Systems   Review of Systems  All other systems reviewed and are negative.   Physical Exam Updated Vital Signs BP 105/67 (BP Location: Right Arm)   Pulse 87   Temp 98.6 F (37 C) (Oral)   Resp 17   Ht 5\' 4"  (1.626 m)   Wt 49.9 kg   SpO2 98%   BMI 18.88 kg/m   Physical Exam Vitals and nursing note reviewed.  Constitutional:      General: He is not in acute distress.    Appearance: He is well-developed.     Comments: Patient resting comfortably in the chair, in no acute respiratory discomfort.  Able to speak in complete sentences.  HENT:     Head: Atraumatic.  Eyes:     Conjunctiva/sclera: Conjunctivae normal.  Cardiovascular:     Rate and Rhythm: Normal rate and regular rhythm.     Pulses: Normal pulses.  Heart sounds: Normal heart sounds.  Pulmonary:     Effort: Pulmonary effort is normal.     Breath sounds: Normal breath sounds. No wheezing, rhonchi or rales.  Musculoskeletal:     Cervical back: Neck supple.  Skin:    Findings: No rash.  Neurological:     Mental Status: He is alert.     ED Results / Procedures / Treatments   Labs (all labs ordered are listed, but only abnormal results are displayed) Labs Reviewed - No data to display  EKG None  Radiology No results found.  Procedures Procedures (including critical care time)  Medications Ordered in ED Medications  albuterol (VENTOLIN HFA) 108 (90 Base) MCG/ACT inhaler 2 puff (has no administration in time range)    ED Course  I have reviewed the triage vital signs and the nursing notes.  Pertinent labs & imaging results that were available during my care  of the patient were reviewed by me and considered in my medical decision making (see chart for details).    MDM Rules/Calculators/A&P                          BP 105/67 (BP Location: Right Arm)   Pulse 87   Temp 98.6 F (37 C) (Oral)   Resp 17   Ht 5\' 4"  (1.626 m)   Wt 49.9 kg   SpO2 98%   BMI 18.88 kg/m   Final Clinical Impression(s) / ED Diagnoses Final diagnoses:  Medication refill  Mild intermittent asthma, unspecified whether complicated    Rx / DC Orders ED Discharge Orders    None     9:14 AM Patient ran out of his medicine inhaler and requesting for a refill.  He is currently not in any acute respiratory discomfort or any obvious asthma exacerbation.  However, provide albuterol inhaler to use as needed.  Encourage patient to primary care provider for further management of his health.   , PA-C 01/10/20 01/12/20    [id number], MD 01/10/20 2204

## 2021-03-21 ENCOUNTER — Encounter (HOSPITAL_COMMUNITY): Payer: Self-pay

## 2021-03-21 ENCOUNTER — Emergency Department (HOSPITAL_COMMUNITY)
Admission: EM | Admit: 2021-03-21 | Discharge: 2021-03-21 | Disposition: A | Discharge status: Home / Self Care | Payer: Self-pay | Attending: Emergency Medicine | Admitting: Emergency Medicine

## 2021-03-21 ENCOUNTER — Other Ambulatory Visit: Payer: Self-pay

## 2021-03-21 DIAGNOSIS — J4520 Mild intermittent asthma, uncomplicated: Secondary | ICD-10-CM | POA: Insufficient documentation

## 2021-03-21 MED ORDER — ALBUTEROL SULFATE HFA 108 (90 BASE) MCG/ACT IN AERS: 1.0000 | INHALATION_SPRAY | Freq: Four times a day (QID) | RESPIRATORY_TRACT | 0 refills | Status: DC | PRN

## 2021-03-21 MED ORDER — ALBUTEROL SULFATE HFA 108 (90 BASE) MCG/ACT IN AERS: 2.0000 | INHALATION_SPRAY | RESPIRATORY_TRACT | Status: DC | PRN

## 2021-03-21 NOTE — Discharge Instructions (Addendum)
Use inhaler as needed.  8 puffs is equal to 1 breathing treatment. Follow-up with your doctor. Return here for new concerns.

## 2021-03-21 NOTE — ED Triage Notes (Signed)
Pt c/o shortness of breath x 2 days. Pt denies pain or fevers. Pt states he has asthma and has ran out of inhalers. 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

## 2021-03-21 NOTE — ED Provider Notes (Signed)
[name] EMERGENCY DEPARTMENT Provider Note   CSN: [id number] Arrival date & time: 03/21/21  0451     History Chief Complaint  Patient presents with   Shortness of Breath    David Dodson is a 25 y.o. male.  The history is provided by the patient and medical records.  Shortness of Breath Associated symptoms: wheezing    25 y.o. M with hx of asthma here with intermittent cough/wheezing x2 days. He ran out of his albuterol inhaler 2 days ago.  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  He does tend to get asthma flares with seasonal changes.  Denies chest pain, fever/chills, sweats, or sick contacts.  No covid exposures.    Past Medical History:  Diagnosis Date   Asthma     Patient Active Problem List   Diagnosis Date Noted   Acute respiratory failure with hypoxia (HCC) secondary to asthma exacerbation 10/16/2016   Acute asthma exacerbation 10/15/2016    Past Surgical History:  Procedure Laterality Date   WISDOM TOOTH EXTRACTION         Family History  Problem Relation Age of Onset   Asthma Neg Hx        Per mother   Heart disease Neg Hx        Per mother    Social History   Tobacco Use   Smoking status: Never   Smokeless tobacco: Never  Substance Use Topics   Alcohol use: No   Drug use: Yes    Types: Marijuana    Comment: 10/15/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 Authorizing Provider  albuterol (VENTOLIN HFA) 108 (90 Base) MCG/ACT inhaler Inhale 1-2 puffs into the lungs every 6 (six) hours as needed for wheezing. 03/21/21  Yes Garlon Hatchet, PA-C    Allergies    Patient has no known allergies.  Review of Systems   Review of Systems  Respiratory:  Positive for shortness of breath and wheezing.   All other systems reviewed and are negative.  Physical Exam Updated Vital Signs BP (!)  110/51 (BP Location: Left Arm)   Pulse 83   Temp 98.2 F (36.8 C) (Oral)   Resp 20   Ht 5\' 5"  (1.651 m)   Wt 56.7 kg   SpO2 98%   BMI 20.80 kg/m   Physical Exam Vitals and nursing note reviewed.  Constitutional:      Appearance: He is well-developed.     Comments: Room smells strongly of marijuana  HENT:     Head: Normocephalic and atraumatic.  Eyes:     Conjunctiva/sclera: Conjunctivae normal.     Pupils: Pupils are equal, round, and reactive to light.  Cardiovascular:     Rate and Rhythm: Normal rate and regular rhythm.     Heart sounds: Normal heart sounds.  Pulmonary:     Effort: Pulmonary effort is normal.     Breath sounds: Wheezing present.     Comments: Very faint end expiratory wheeze, NAD, speaking in full sentences without difficulty Abdominal:     General: Bowel sounds are normal.     Palpations: Abdomen is soft.  Musculoskeletal:        General: Normal range of motion.     Cervical back: Normal range of motion.  Skin:    General: Skin is warm and dry.  Neurological:  Mental Status: He is alert and oriented to person, place, and time.    ED Results / Procedures / Treatments   Labs (all labs ordered are listed, but only abnormal results are displayed) Labs Reviewed - No data to display  EKG None  Radiology No results found.  Procedures Procedures   Medications Ordered in ED Medications  albuterol (VENTOLIN HFA) 108 (90 Base) MCG/ACT inhaler 2 puff (has no administration in time range)    ED Course  I have reviewed the triage vital signs and the nursing notes.  Pertinent labs & imaging results that were available during my care of the patient were reviewed by me and considered in my medical decision making (see chart for details).    MDM Rules/Calculators/A&P                           25 y.o. M here with cough/wheezing after running of inhaler 2 days ago.  History of asthma and issues during seasonal changes. He is afebrile, non-toxic.   He is in NAD, very faint end expiratory wheezing.  VSS on RA.  Suspect this is related to asthma.  Given inhaler here along with refill.  Can follow-up with PCP.  Return here for new concerns.  Final Clinical Impression(s) / ED Diagnoses Final diagnoses:  Mild intermittent asthma without complication    Rx / DC Orders ED Discharge Orders          Ordered    albuterol (VENTOLIN HFA) 108 (90 Base) MCG/ACT inhaler  Every 6 hours PRN        03/21/21 0512             Garlon Hatchet, PA-C 03/21/21 0555    Mesner, Barbara Cower, MD 03/21/21 [id number]

## 2024-01-05 ENCOUNTER — Emergency Department (HOSPITAL_COMMUNITY)
Admission: EM | Admit: 2024-01-05 | Discharge: 2024-01-05 | Disposition: A | Discharge status: Home / Self Care | Payer: Self-pay | Attending: Emergency Medicine | Admitting: Emergency Medicine

## 2024-01-05 ENCOUNTER — Encounter (HOSPITAL_COMMUNITY): Payer: Self-pay | Admitting: *Deleted

## 2024-01-05 ENCOUNTER — Other Ambulatory Visit: Payer: Self-pay

## 2024-01-05 DIAGNOSIS — J45901 Unspecified asthma with (acute) exacerbation: Secondary | ICD-10-CM | POA: Insufficient documentation

## 2024-01-05 DIAGNOSIS — R059 Cough, unspecified: Secondary | ICD-10-CM

## 2024-01-05 MED ORDER — IPRATROPIUM-ALBUTEROL 0.5-2.5 (3) MG/3ML IN SOLN
3.0000 mL | Freq: Once | RESPIRATORY_TRACT | Status: AC
  Administered 2024-01-05: 3 mL via RESPIRATORY_TRACT
  Filled 2024-01-05: qty 3

## 2024-01-05 MED ORDER — PREDNISONE 20 MG PO TABS
60.0000 mg | ORAL_TABLET | Freq: Once | ORAL | Status: AC
  Administered 2024-01-05: 60 mg via ORAL
  Filled 2024-01-05: qty 3

## 2024-01-05 MED ORDER — ALBUTEROL SULFATE HFA 108 (90 BASE) MCG/ACT IN AERS: 1.0000 | INHALATION_SPRAY | Freq: Four times a day (QID) | RESPIRATORY_TRACT | 5 refills | Status: AC | PRN

## 2024-01-05 MED ORDER — PREDNISONE 10 MG PO TABS: 50.0000 mg | ORAL_TABLET | Freq: Every day | ORAL | 0 refills | Status: AC

## 2024-01-05 MED ORDER — ALBUTEROL SULFATE HFA 108 (90 BASE) MCG/ACT IN AERS
1.0000 | INHALATION_SPRAY | Freq: Once | RESPIRATORY_TRACT | Status: AC
  Administered 2024-01-05: 1 via RESPIRATORY_TRACT
  Filled 2024-01-05: qty 6.7

## 2024-01-05 NOTE — Discharge Instructions (Addendum)
 Evaluation today was overall reassuring.  Suspect you are having a mild asthma attack secondary to a viral illness.  I sent albuterol  to your pharmacy and prednisone  which is a steroid for treatment.  Please take the prednisone  for the next 5 days.  Also please establish care with PCP as they will be the ones to help you manage your asthma long-term.  If you have worsening shortness of breath, chest pain, cough and fever or any other concerning symptom please return to the ED for further evaluation.

## 2024-01-05 NOTE — ED Triage Notes (Signed)
 The pt is c/o upper respiratory difficulty  for 2-3 days cold congestion unknown temp

## 2024-01-05 NOTE — ED Triage Notes (Signed)
 Sl audible  wheezes

## 2024-01-05 NOTE — ED Provider Notes (Signed)
 [place] EMERGENCY DEPARTMENT AT [facility] Provider Note   CSN: [id number] Arrival date & time: 01/05/24  2113     History  Chief Complaint  Patient presents with   Cough   HPI David Dodson is a 28 y.o. male with history of asthma presenting for cough and shortness of breath.  The cough started 3 days ago.  It is nonproductive.  Also endorsing nasal congestion.  He states that his 29-year-old had the same symptoms a few days ago as well.  He states yesterday he started to feel a little bit short of breath but no chest pain.  He mentioned that he ran out of his albuterol  inhaler and has had difficulty due to cost obtaining another inhaler.  He also mentioned that he does not have a primary care doctor as well.  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  Denies lower extremity edema or calf tenderness, recent long trips or known history of blood clots.   Cough      Home Medications Prior to Admission medications   Medication Sig Start Date End Date Taking? Authorizing Provider  albuterol  (VENTOLIN  HFA) 108 (90 Base) MCG/ACT inhaler Inhale 1-2 puffs into the lungs every 6 (six) hours as needed for wheezing or shortness of breath. 01/05/24  Yes Deforest Maiden K, PA-C  predniSONE  (DELTASONE ) 10 MG tablet Take 5 tablets (50 mg total) by mouth daily for 5 days. 01/05/24 01/10/24 Yes Di Jasmer K, PA-C      Allergies    Patient has no known allergies.    Review of Systems   Review of Systems  Respiratory:  Positive for cough.     Physical Exam Updated Vital Signs BP 107/72 (BP Location: Right Arm)   Pulse 88   Temp 98.9 F (37.2 C)   Resp 15   Ht 5' 5 (1.651 m)   Wt 59 kg   SpO2 94%   BMI 21.63 kg/m  Physical Exam Vitals and nursing note reviewed.  HENT:     Head: Normocephalic and atraumatic.     Mouth/Throat:     Mouth: Mucous membranes are moist.  Eyes:     General:        Right eye: No discharge.         Left eye: No discharge.     Conjunctiva/sclera: Conjunctivae normal.  Cardiovascular:     Rate and Rhythm: Normal rate and regular rhythm.     Pulses: Normal pulses.     Heart sounds: Normal heart sounds.  Pulmonary:     Effort: Pulmonary effort is normal.     Breath sounds: Examination of the right-middle field reveals wheezing. Examination of the left-middle field reveals wheezing. Examination of the right-lower field reveals wheezing. Examination of the left-lower field reveals wheezing. Wheezing present. No decreased breath sounds, rhonchi or rales.  Abdominal:     General: Abdomen is flat.     Palpations: Abdomen is soft.  Skin:    General: Skin is warm and dry.  Neurological:     General: No focal deficit present.  Psychiatric:        Mood and Affect: Mood normal.     ED Results / Procedures / Treatments   Labs (all labs ordered are listed, but only abnormal results are displayed) Labs Reviewed - No data to display  EKG None  Radiology No results found.  Procedures Procedures    Medications Ordered in ED Medications  ipratropium-albuterol  (DUONEB) 0.5-2.5 (3) MG/3ML nebulizer solution 3 mL (3 mLs Nebulization Given 01/05/24 2146)  albuterol  (VENTOLIN  HFA) 108 (90 Base) MCG/ACT inhaler 1 puff (1 puff Inhalation Given 01/05/24 2146)  predniSONE  (DELTASONE ) tablet 60 mg (60 mg Oral Given 01/05/24 2146)    ED Course/ Medical Decision Making/ A&P                                 Medical Decision Making Risk Prescription drug management.   28 year old well-appearing male presenting for URI symptoms and shortness of breath.  Exam notable for mild expiratory wheezing but he does not appear to be in respiratory distress at this time.  Considered CHF, PE, pneumothorax, pneumonia but feel this is likely a mild asthma exacerbation precipitated by a viral URI illness. We discussed the utility of an x-ray but he elected to decline which I thought was appropriate given he does  not have a fever, lung sounds are mostly reassuring aside from the wheezing there is no rales, asymmetric air entry or diminished sounds. Treated with DuoNeb and prednisone . Sent albuterol  inhalers to his pharmacy and a 5-day course of prednisone .  Discussed establishing care with PCP and provided contact information.  Also discussed supportive treatment at home for viral illness and return precautions.  Discharged in good condition.        Final Clinical Impression(s) / ED Diagnoses Final diagnoses:  Cough, unspecified type  Mild asthma with exacerbation, unspecified whether persistent    Rx / DC Orders ED Discharge Orders          Ordered    albuterol  (VENTOLIN  HFA) 108 (90 Base) MCG/ACT inhaler  Every 6 hours PRN        01/05/24 2147    predniSONE  (DELTASONE ) 10 MG tablet  Daily        01/05/24 2147              Janalee Mcmurray, PA-C 01/05/24 2148    Wynetta Heckle, MD 01/12/24 [id number]
